# Patient Record
Sex: Female | Born: 2000 | Race: White | Hispanic: No | Marital: Single | Smoking: Never smoker
Health system: Southern US, Community
[De-identification: ages and names within clinical notes are randomized; demographics above are authoritative.]

## PROBLEM LIST (undated history)

## (undated) DIAGNOSIS — J45909 Unspecified asthma, uncomplicated: Secondary | ICD-10-CM

---

## 2020-12-31 ENCOUNTER — Ambulatory Visit (HOSPITAL_COMMUNITY)
Admission: EM | Admit: 2020-12-31 | Discharge: 2020-12-31 | Disposition: A | Discharge status: Home / Self Care | Payer: Self-pay | Attending: Internal Medicine | Admitting: Internal Medicine

## 2020-12-31 ENCOUNTER — Other Ambulatory Visit: Payer: Self-pay

## 2020-12-31 ENCOUNTER — Ambulatory Visit (INDEPENDENT_AMBULATORY_CARE_PROVIDER_SITE_OTHER): Payer: Self-pay

## 2020-12-31 ENCOUNTER — Ambulatory Visit (HOSPITAL_COMMUNITY): Payer: Self-pay

## 2020-12-31 ENCOUNTER — Encounter (HOSPITAL_COMMUNITY): Payer: Self-pay | Admitting: Emergency Medicine

## 2020-12-31 DIAGNOSIS — M25562 Pain in left knee: Secondary | ICD-10-CM

## 2020-12-31 DIAGNOSIS — S83412A Sprain of medial collateral ligament of left knee, initial encounter: Secondary | ICD-10-CM

## 2020-12-31 HISTORY — DX: Unspecified asthma, uncomplicated: J45.909

## 2020-12-31 MED ORDER — IBUPROFEN 600 MG PO TABS: 600.0000 mg | ORAL_TABLET | Freq: Four times a day (QID) | ORAL | 0 refills | Status: AC | PRN

## 2020-12-31 NOTE — Discharge Instructions (Signed)
Please wear knee sprain when walking Please take medications as directed If you continue to have pain please go to [facility] urgent care to be evaluated further
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
## 2022-04-12 ENCOUNTER — Ambulatory Visit: Payer: Self-pay | Admitting: Nurse Practitioner

## 2022-05-02 ENCOUNTER — Ambulatory Visit: Payer: 59 | Admitting: Nurse Practitioner
